# Patient Record
Sex: Male | Born: 1955 | Race: White | Hispanic: No | Smoking: Current every day smoker
Health system: Southern US, Community
[De-identification: ages and names within clinical notes are randomized; demographics above are authoritative.]

## PROBLEM LIST (undated history)

## (undated) DIAGNOSIS — Z930 Tracheostomy status: Secondary | ICD-10-CM

## (undated) DIAGNOSIS — M259 Joint disorder, unspecified: Secondary | ICD-10-CM

## (undated) DIAGNOSIS — J302 Other seasonal allergic rhinitis: Secondary | ICD-10-CM

## (undated) DIAGNOSIS — J449 Chronic obstructive pulmonary disease, unspecified: Secondary | ICD-10-CM

## (undated) DIAGNOSIS — M1990 Unspecified osteoarthritis, unspecified site: Secondary | ICD-10-CM

## (undated) DIAGNOSIS — Z972 Presence of dental prosthetic device (complete) (partial): Secondary | ICD-10-CM

---

## 2012-05-07 ENCOUNTER — Ambulatory Visit: Payer: Self-pay

## 2013-10-15 IMAGING — CR DG KNEE 1-2V*L*
1 series · 2 of 2 positions shown · non-contrast
Comparison: none

REASON FOR EXAM: Arthritis, - FAX TP [REDACTED]
COMMENTS:

PROCEDURE:     DXR - DXR KNEE LEFT AP AND LATERAL  - May 07, 2012  [DATE]
RESULT:     History: Arthritis.
Comparison Study: No prior.

[Series 1: t knee ap left · 0.14mm/px · 2 of 2 slices shown]
[im 1/2]
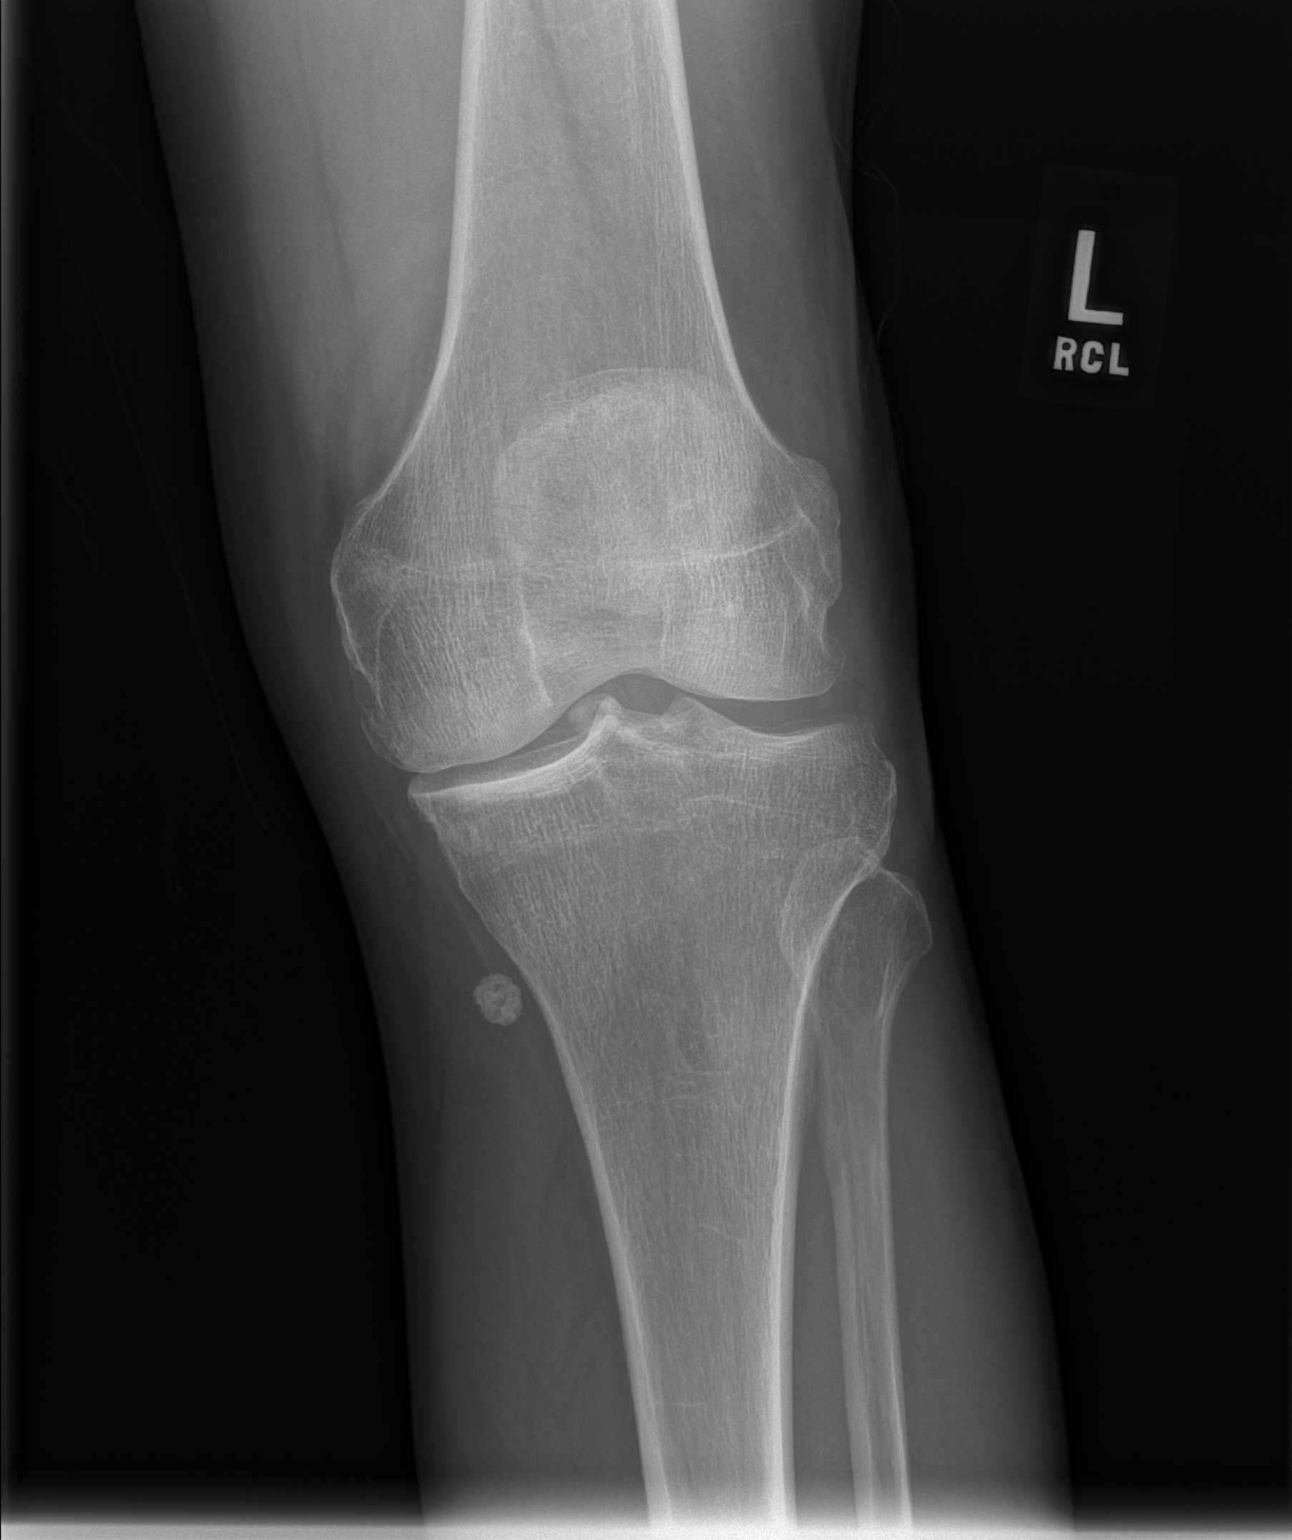
[im 2/2]
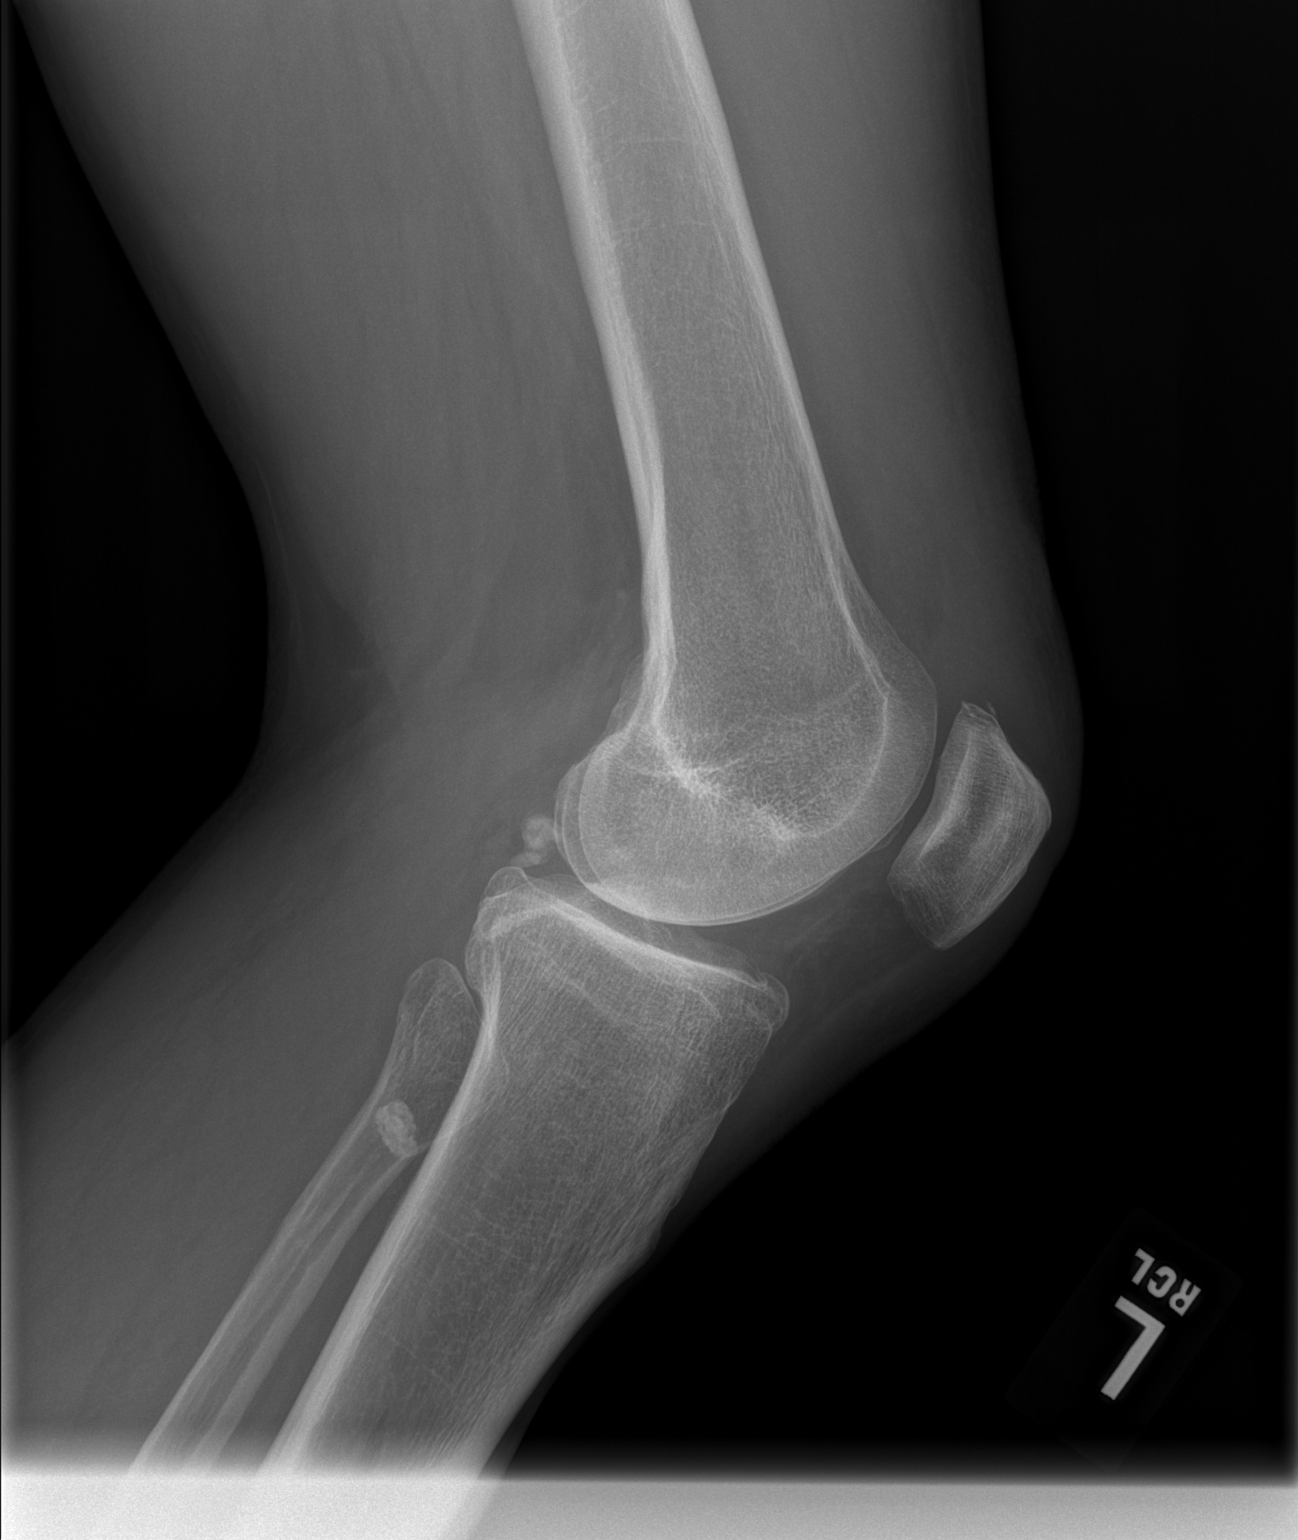

[2 of 2 positions shown; findings below may reference images not displayed]

FINDINGS: Patellofemoral and femoral-tibial degenerative change noted. Loose
bodies are noted about the left knee joint space. Loss of medial compartment
knee joint space particularly prominent. No evidence of erosive arthropathy.
No focal acute bony abnormality.
IMPRESSION: Degenerative change with loose bodies.

## 2013-10-15 IMAGING — CR DG KNEE 1-2V*R*
1 series · 2 of 2 positions shown · non-contrast
Comparison: none

REASON FOR EXAM: Arthritis, - FAX TP [REDACTED]
COMMENTS:

PROCEDURE:     DXR - DXR KNEE RIGHT AP AND LATERAL  - May 07, 2012  [DATE]
RESULT:     History: Arthritis.
Comparison Study: No prior.

[Series 1: t knee ap right · 0.14mm/px · 2 of 2 slices shown]
[im 1/2]
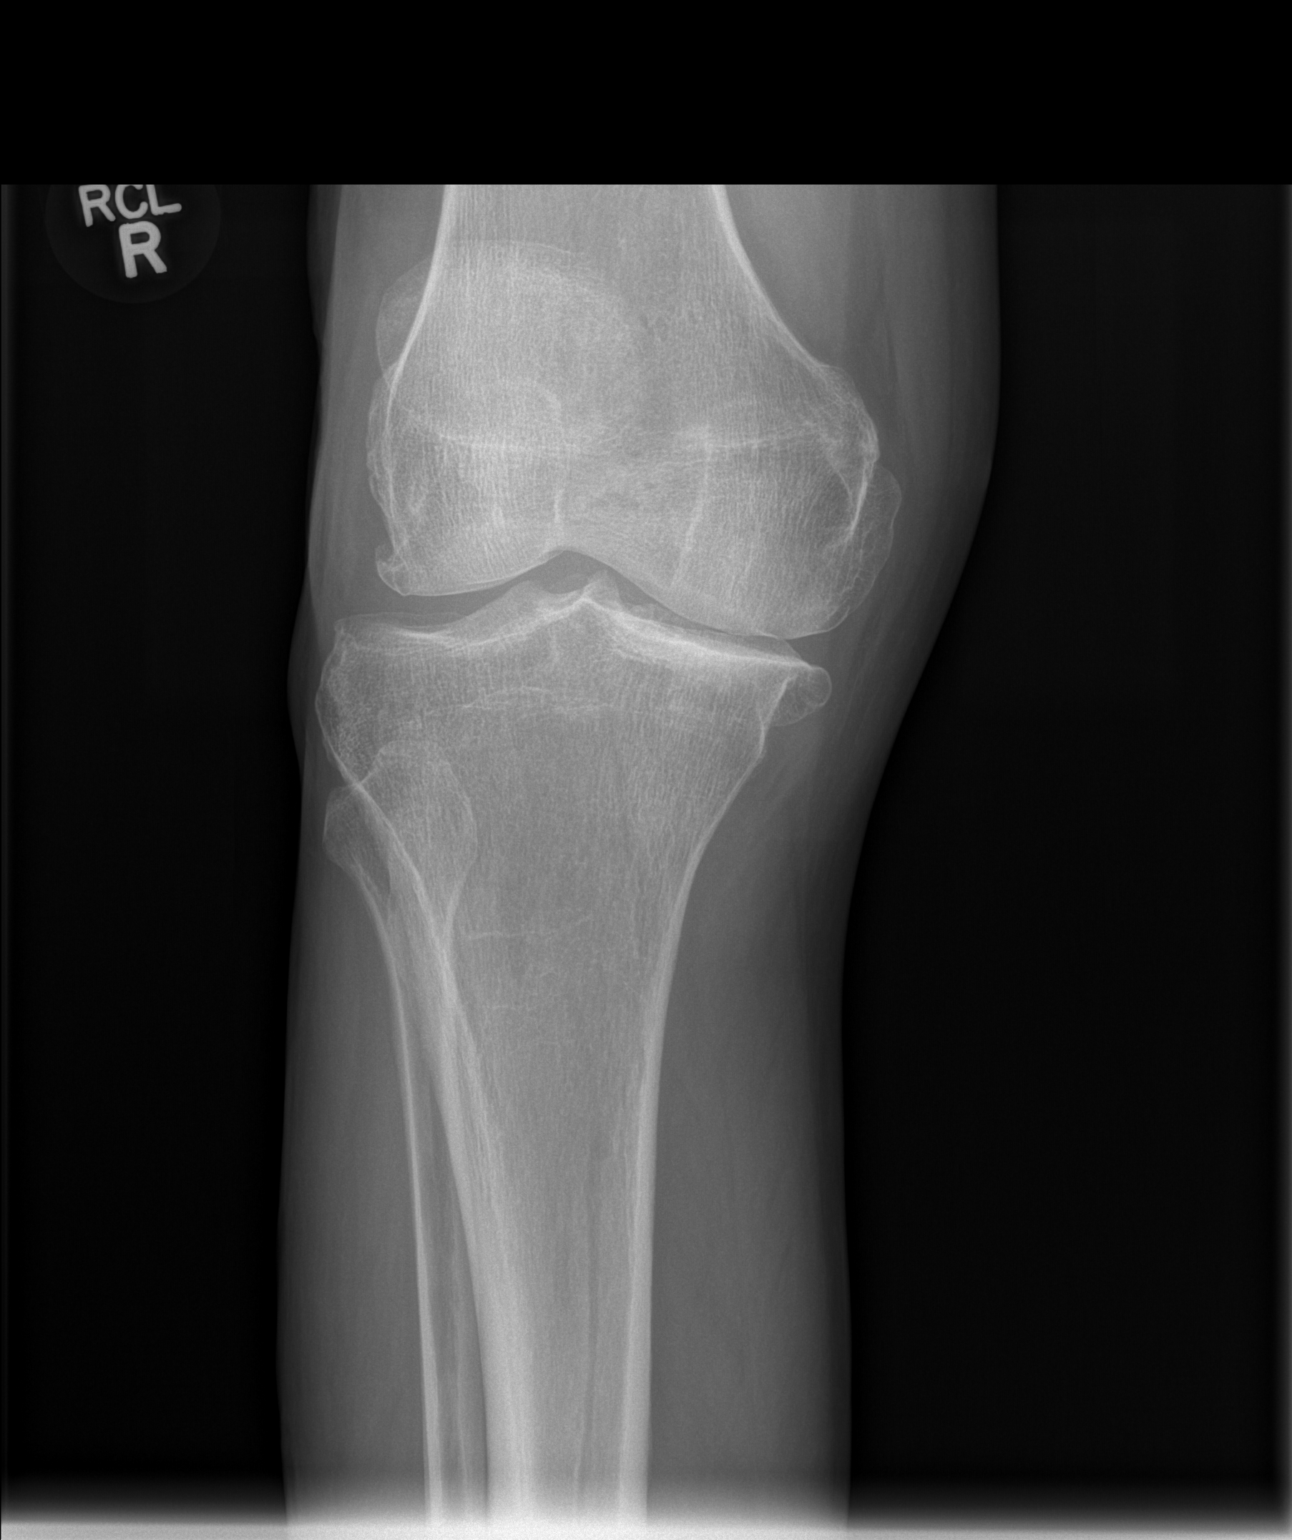
[im 2/2]
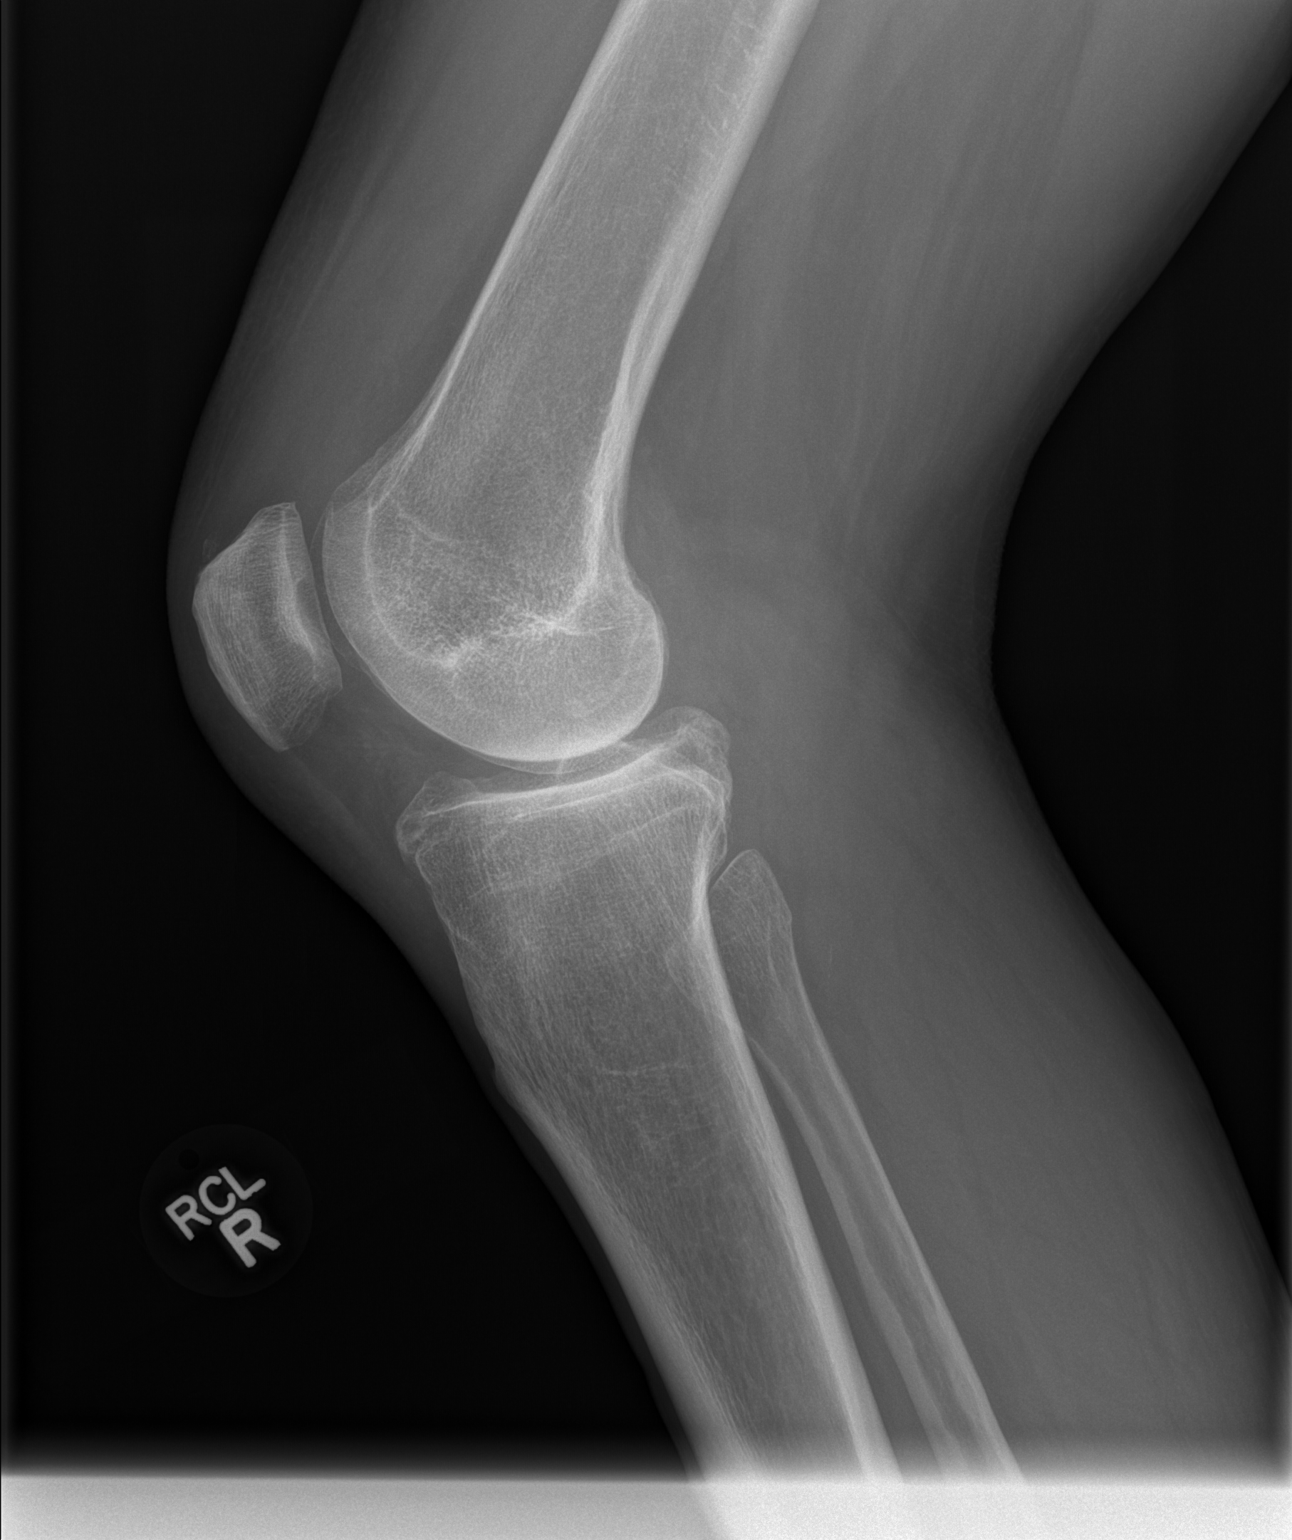

[2 of 2 positions shown; findings below may reference images not displayed]

FINDINGS: Patellofemoral and femoral-tibial degenerative change present with
particularly prominent loss of joint space over the medial compartment.
Prominent osteophytes are noted. Moderate lateral subluxation of the tibia
is present, most likely degenerative. No acute bony abnormality or focal
bony abnormality noted.
IMPRESSION: Severe degenerative changes right knee with lateral tibial
subluxation, most likely degenerative.

## 2013-10-15 IMAGING — CR DG LUMBAR SPINE 2-3V
1 series · 3 of 3 positions shown · non-contrast
Comparison: none

REASON FOR EXAM: Arthritis, Crushed Disks- FAX TP [REDACTED]
COMMENTS:

[Series 1: t lumbar spine ap · 0.14mm/px · 3 of 3 slices shown]
[im 1/3]
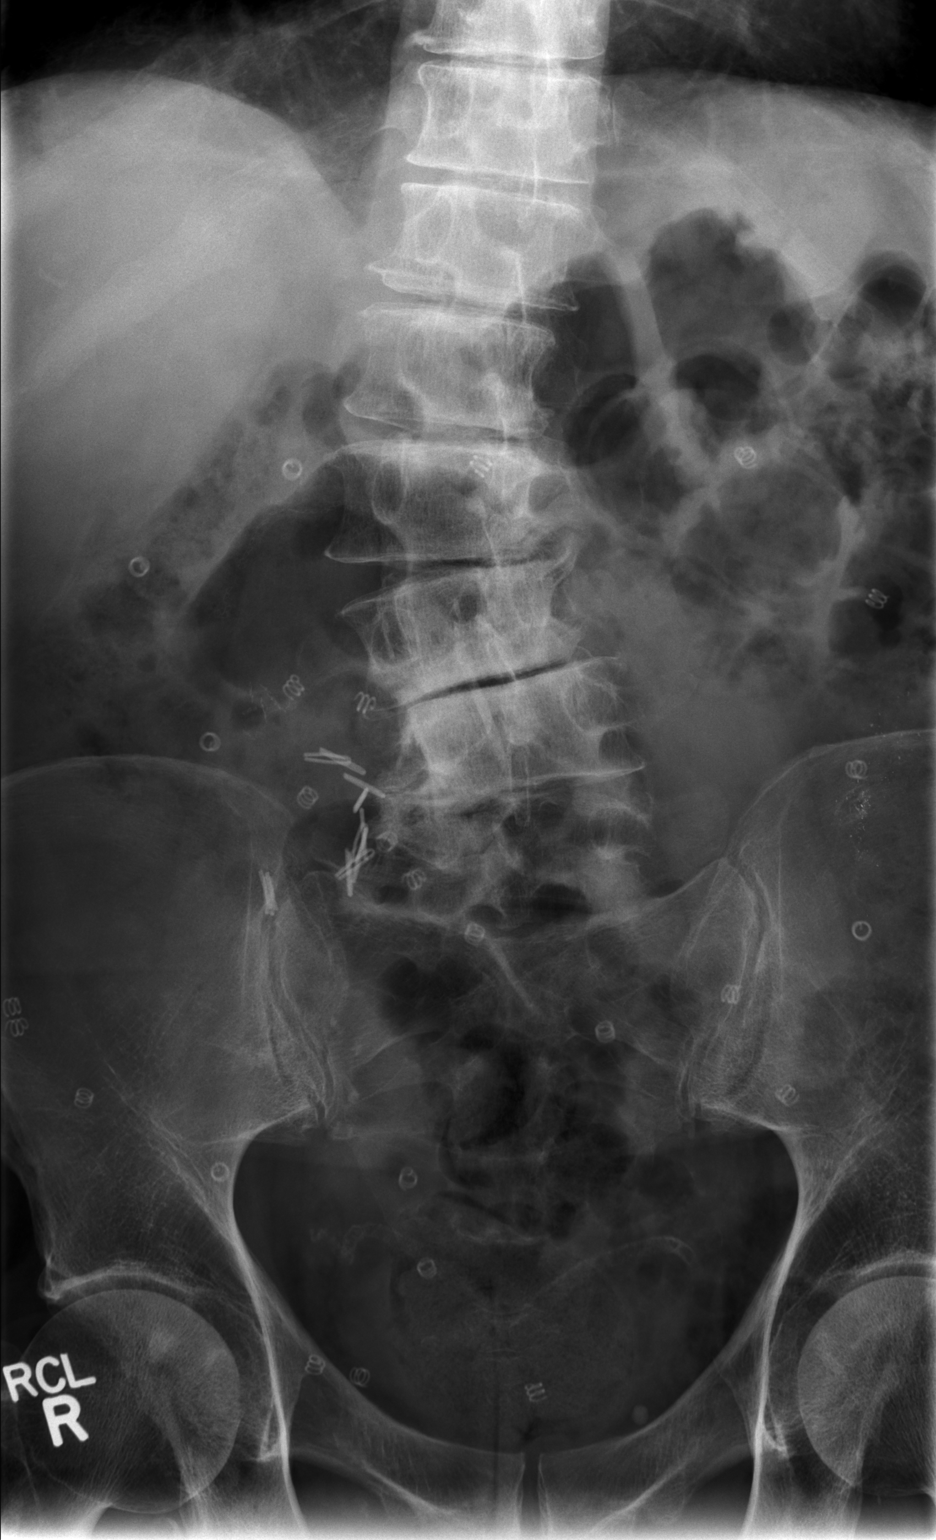
[im 2/3]
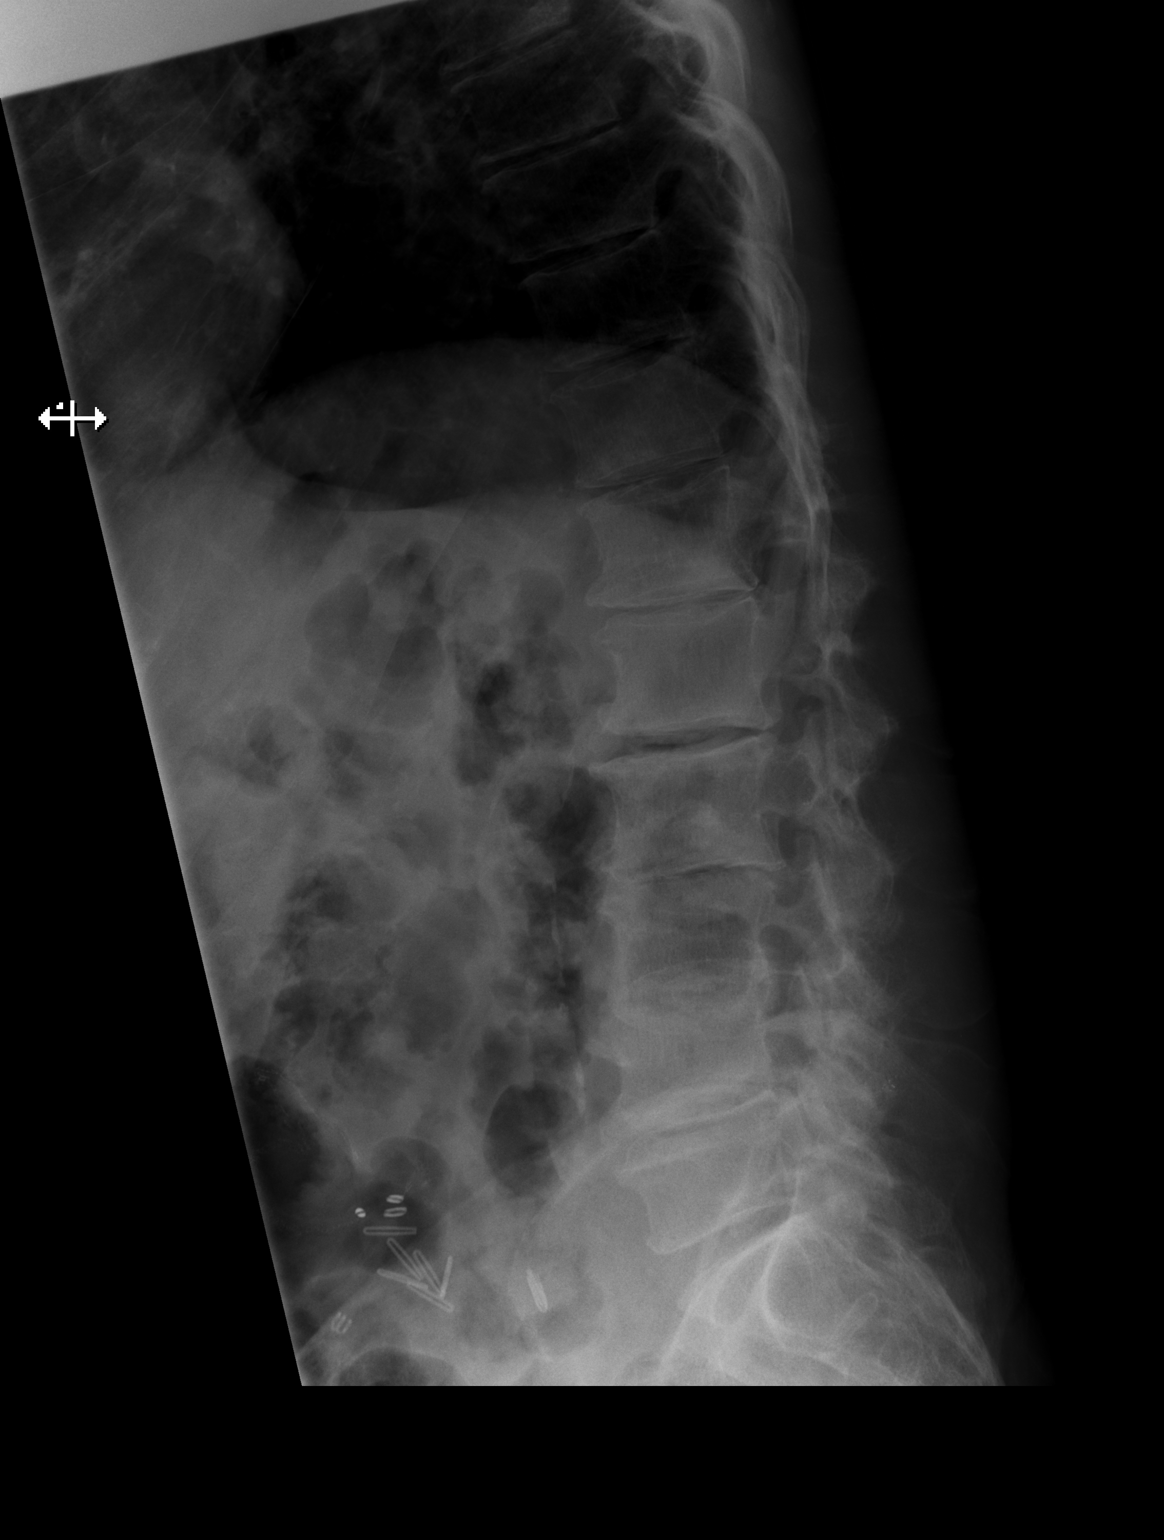
[im 3/3]
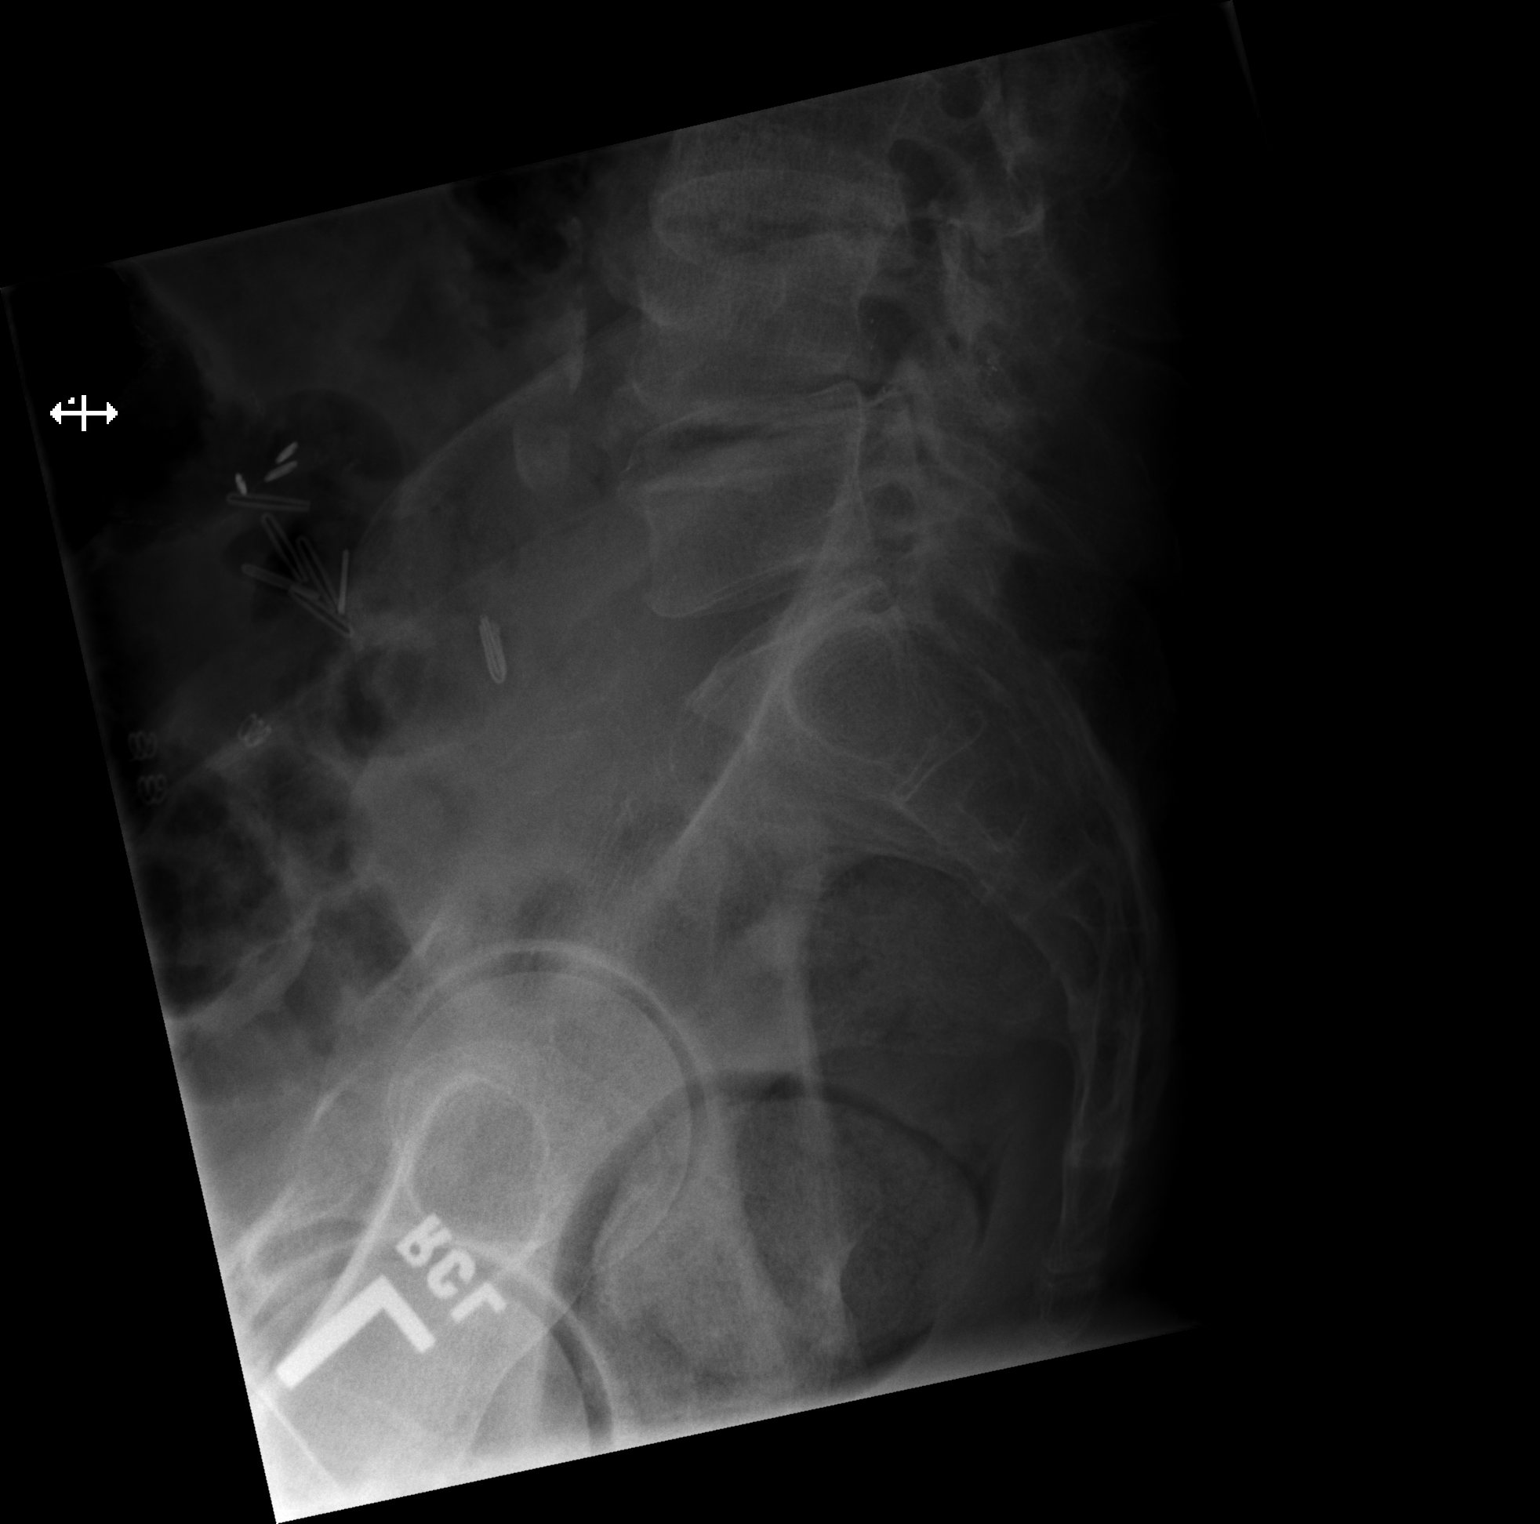

[3 of 3 positions shown; findings below may reference images not displayed]

PROCEDURE:     DXR - DXR LUMBAR SPINE AP AND LATERAL  - May 07, 2012  [DATE]

RESULT:     Severe degenerative changes thoracic and lumbar spinal with
scoliosis of the lumbar spine concave left. Pedicles are intact.
Degenerative changes present both hips. Surgical clips are noted over the
abdomen. Prior hernia repairs. Benign calcifications in pelvis including
phleboliths. Gas pattern is nonspecific.
IMPRESSION: Severe degenerative changes of the lumbar spine with
scoliosis concave left. No acute abnormality.

## 2021-11-21 ENCOUNTER — Encounter: Payer: Self-pay | Admitting: Ophthalmology

## 2021-11-26 NOTE — Discharge Instructions (Signed)

## 2021-11-27 ENCOUNTER — Ambulatory Visit: Payer: Medicare PPO | Admitting: Anesthesiology

## 2021-11-27 ENCOUNTER — Other Ambulatory Visit: Payer: Self-pay

## 2021-11-27 ENCOUNTER — Encounter: Payer: Self-pay | Admitting: Ophthalmology

## 2021-11-27 ENCOUNTER — Encounter
Admission: RE | Disposition: A | Discharge status: Home / Self Care | Payer: Self-pay | Source: Home / Self Care | Attending: Ophthalmology

## 2021-11-27 ENCOUNTER — Ambulatory Visit
Admission: RE | Admit: 2021-11-27 | Discharge: 2021-11-27 | Disposition: A | Discharge status: Home / Self Care | Payer: Medicare PPO | Attending: Ophthalmology | Admitting: Ophthalmology

## 2021-11-27 DIAGNOSIS — H2511 Age-related nuclear cataract, right eye: Secondary | ICD-10-CM | POA: Diagnosis present

## 2021-11-27 DIAGNOSIS — Z930 Tracheostomy status: Secondary | ICD-10-CM | POA: Insufficient documentation

## 2021-11-27 DIAGNOSIS — J449 Chronic obstructive pulmonary disease, unspecified: Secondary | ICD-10-CM | POA: Diagnosis not present

## 2021-11-27 DIAGNOSIS — M1990 Unspecified osteoarthritis, unspecified site: Secondary | ICD-10-CM | POA: Insufficient documentation

## 2021-11-27 DIAGNOSIS — F17210 Nicotine dependence, cigarettes, uncomplicated: Secondary | ICD-10-CM | POA: Insufficient documentation

## 2021-11-27 HISTORY — DX: Tracheostomy status: Z93.0

## 2021-11-27 HISTORY — DX: Unspecified osteoarthritis, unspecified site: M19.90

## 2021-11-27 HISTORY — DX: Presence of dental prosthetic device (complete) (partial): Z97.2

## 2021-11-27 HISTORY — PX: CATARACT EXTRACTION W/PHACO: SHX586

## 2021-11-27 HISTORY — DX: Chronic obstructive pulmonary disease, unspecified: J44.9

## 2021-11-27 SURGERY — PHACOEMULSIFICATION, CATARACT, WITH IOL INSERTION
Anesthesia: Monitor Anesthesia Care | Site: Eye | Laterality: Right

## 2021-11-27 MED ORDER — ARMC OPHTHALMIC DILATING DROPS
1.0000 | OPHTHALMIC | Status: DC | PRN
  Administered 2021-11-27 (×3): 1 via OPHTHALMIC

## 2021-11-27 MED ORDER — SIGHTPATH DOSE#1 NA HYALUR & NA CHOND-NA HYALUR IO KIT
PACK | INTRAOCULAR | Status: DC | PRN
  Administered 2021-11-27: 1 via OPHTHALMIC

## 2021-11-27 MED ORDER — TETRACAINE HCL 0.5 % OP SOLN
1.0000 [drp] | OPHTHALMIC | Status: DC | PRN
  Administered 2021-11-27 (×3): 1 [drp] via OPHTHALMIC

## 2021-11-27 MED ORDER — FENTANYL CITRATE (PF) 100 MCG/2ML IJ SOLN
INTRAMUSCULAR | Status: DC | PRN
  Administered 2021-11-27: 100 ug via INTRAVENOUS

## 2021-11-27 MED ORDER — CEFUROXIME OPHTHALMIC INJECTION 1 MG/0.1 ML
INJECTION | OPHTHALMIC | Status: DC | PRN
  Administered 2021-11-27: .1 mL via INTRACAMERAL

## 2021-11-27 MED ORDER — MIDAZOLAM HCL 2 MG/2ML IJ SOLN
INTRAMUSCULAR | Status: DC | PRN
  Administered 2021-11-27: 2 mg via INTRAVENOUS

## 2021-11-27 MED ORDER — SIGHTPATH DOSE#1 BSS IO SOLN
INTRAOCULAR | Status: DC | PRN
  Administered 2021-11-27: 1 mL via INTRAMUSCULAR

## 2021-11-27 MED ORDER — SIGHTPATH DOSE#1 BSS IO SOLN
INTRAOCULAR | Status: DC | PRN
  Administered 2021-11-27: 58 mL via OPHTHALMIC

## 2021-11-27 MED ORDER — SIGHTPATH DOSE#1 BSS IO SOLN
INTRAOCULAR | Status: DC | PRN
  Administered 2021-11-27: 15 mL

## 2021-11-27 MED ORDER — BRIMONIDINE TARTRATE-TIMOLOL 0.2-0.5 % OP SOLN
OPHTHALMIC | Status: DC | PRN
  Administered 2021-11-27: 1 [drp] via OPHTHALMIC

## 2021-11-27 SURGICAL SUPPLY — 10 items
CATARACT SUITE SIGHTPATH (MISCELLANEOUS) ×1 IMPLANT
FEE CATARACT SUITE SIGHTPATH (MISCELLANEOUS) ×1 IMPLANT
GLOVE SRG 8 PF TXTR STRL LF DI (GLOVE) ×1 IMPLANT
GLOVE SURG ENC TEXT LTX SZ7.5 (GLOVE) ×1 IMPLANT
GLOVE SURG UNDER POLY LF SZ8 (GLOVE) ×1
LENS IOL TECNIS EYHANCE 19.0 (Intraocular Lens) IMPLANT
NDL FILTER BLUNT 18X1 1/2 (NEEDLE) ×1 IMPLANT
NEEDLE FILTER BLUNT 18X1 1/2 (NEEDLE) ×1 IMPLANT
SYR 3ML LL SCALE MARK (SYRINGE) ×1 IMPLANT
WATER STERILE IRR 250ML POUR (IV SOLUTION) ×1 IMPLANT

## 2021-11-27 NOTE — Transfer of Care (Signed)
Immediate Anesthesia Transfer of Care Note  Patient: Marcus Hester  Procedure(s) Performed: CATARACT EXTRACTION PHACO AND INTRAOCULAR LENS PLACEMENT (IOC) RIGHT  11.61  01:19.3 (Right: Eye)  Patient Location: PACU  Anesthesia Type: MAC  Level of Consciousness: awake, alert  and patient cooperative  Airway and Oxygen Therapy: Patient Spontanous Breathing and Patient connected to supplemental oxygen  Post-op Assessment: Post-op Vital signs reviewed, Patient's Cardiovascular Status Stable, Respiratory Function Stable, Patent Airway and No signs of Nausea or vomiting  Post-op Vital Signs: Reviewed and stable  Complications: There were no known notable events for this encounter.

## 2021-11-27 NOTE — H&P (Signed)
  [facility]   Primary Care Physician:  Inc, [facility] Ophthalmologist: Dr. Lockie Mola  Pre-Procedure History & Physical: HPI:  Marcus Hester is a 66 y.o. male here for ophthalmic surgery.   Past Medical History:  Diagnosis Date   Arthritis    COPD (chronic obstructive pulmonary disease) (HCC)    Tracheostomy present (HCC)    Wears dentures    full upper    History reviewed. No pertinent surgical history.  Prior to Admission medications   Medication Sig Start Date End Date Taking? Authorizing Provider  acetaminophen (TYLENOL) 650 MG CR tablet Take 650 mg by mouth every 8 (eight) hours as needed for pain.   Yes [provider]  budesonide (PULMICORT) 0.25 MG/2ML nebulizer solution Take 0.25 mg by nebulization 2 (two) times daily.   Yes [provider]  gabapentin (NEURONTIN) 100 MG capsule Take 100 mg by mouth 3 (three) times daily.   Yes [provider]  phenylephrine (SUDAFED PE) 10 MG TABS tablet Take 10 mg by mouth in the morning and at bedtime.   Yes [provider]    Allergies as of 10/25/2021   (Not on File)    History reviewed. No pertinent family history.  Social History   Socioeconomic History   Marital status: Divorced    Spouse name: Not on file   Number of children: Not on file   Years of education: Not on file   Highest education level: Not on file  Occupational History   Not on file  Tobacco Use   Smoking status: Every Day    Packs/day: 1.00    Years: 50.00    Total pack years: 50.00    Types: Cigarettes   Smokeless tobacco: Never   Tobacco comments:    Started smoking around age 84  Vaping Use   Vaping Use: Never used  Substance and Sexual Activity   Alcohol use: Not Currently   Drug use: Not Currently   Sexual activity: Not on file  Other Topics Concern   Not on file  Social History Narrative   Not on file   Social Determinants of Health   Financial Resource Strain:  Not on file  Food Insecurity: Not on file  Transportation Needs: Not on file  Physical Activity: Not on file  Stress: Not on file  Social Connections: Not on file  Intimate Partner Violence: Not on file    Review of Systems: See HPI, otherwise negative ROS  Physical Exam: Ht 5\' 9"  (1.753 m)   Wt 77.1 kg   BMI 25.10 kg/m  General:   Alert,  pleasant and cooperative in NAD Head:  Normocephalic and atraumatic. Lungs:  Clear to auscultation.    Heart:  Regular rate and rhythm.   Impression/Plan: Marcus Hester is here for ophthalmic surgery.  Risks, benefits, limitations, and alternatives regarding ophthalmic surgery have been reviewed with the patient.  Questions have been answered.  All parties agreeable.   Reino Kent, MD  11/27/2021, 7:30 AM

## 2021-11-27 NOTE — Anesthesia Postprocedure Evaluation (Signed)
Anesthesia Post Note  Patient: Marcus Hester  Procedure(s) Performed: CATARACT EXTRACTION PHACO AND INTRAOCULAR LENS PLACEMENT (IOC) RIGHT  11.61  01:19.3 (Right: Eye)  Patient location during evaluation: PACU Anesthesia Type: MAC Level of consciousness: awake and alert Pain management: pain level controlled Vital Signs Assessment: post-procedure vital signs reviewed and stable Respiratory status: spontaneous breathing, nonlabored ventilation, respiratory function stable and patient connected to nasal cannula oxygen Cardiovascular status: stable and blood pressure returned to baseline Postop Assessment: no apparent nausea or vomiting Anesthetic complications: no   There were no known notable events for this encounter.   Last Vitals: There were no vitals filed for this visit.  Last Pain: There were no vitals filed for this visit.               Ilene Qua

## 2021-11-27 NOTE — Anesthesia Postprocedure Evaluation (Signed)
Anesthesia Post Note  Patient: Marcus Hester  Procedure(s) Performed: CATARACT EXTRACTION PHACO AND INTRAOCULAR LENS PLACEMENT (IOC) RIGHT  11.61  01:19.3 (Right: Eye)  Patient location during evaluation: PACU Anesthesia Type: MAC Level of consciousness: awake and alert Pain management: pain level controlled Vital Signs Assessment: post-procedure vital signs reviewed and stable Respiratory status: spontaneous breathing, nonlabored ventilation, respiratory function stable and patient connected to nasal cannula oxygen Cardiovascular status: stable and blood pressure returned to baseline Postop Assessment: no apparent nausea or vomiting Anesthetic complications: no   There were no known notable events for this encounter.   Last Vitals:  Vitals:   11/27/21 0905 11/27/21 0910  BP: 101/64 96/62  Pulse: 69 80  Resp: 13 15  Temp:    SpO2: 90% 91%    Last Pain: There were no vitals filed for this visit.               Ilene Qua

## 2021-11-27 NOTE — Op Note (Signed)
  LOCATION:  [facility]   PREOPERATIVE DIAGNOSIS:    Nuclear sclerotic cataract right eye. H25.11   POSTOPERATIVE DIAGNOSIS:  Nuclear sclerotic cataract right eye.     PROCEDURE:  Phacoemusification with posterior chamber intraocular lens placement of the right eye   ULTRASOUND TIME: Procedure(s): CATARACT EXTRACTION PHACO AND INTRAOCULAR LENS PLACEMENT (IOC) RIGHT  11.61  01:19.3 (Right)  LENS:   Implant Name Type Inv. Item Serial No. Manufacturer Lot No. LRB No. Used Action  LENS IOL TECNIS EYHANCE 19.0 - [id number] Intraocular Lens LENS IOL TECNIS EYHANCE 19.0 [id number] [employer]  Right 1 Implanted         SURGEON:  Deirdre Evener, MD   ANESTHESIA:  Topical with tetracaine drops and 2% Xylocaine jelly, augmented with 1% preservative-free intracameral lidocaine.    COMPLICATIONS:  None.   DESCRIPTION OF PROCEDURE:  The patient was identified in the holding room and transported to the operating room and placed in the supine position under the operating microscope.  The right eye was identified as the operative eye and it was prepped and draped in the usual sterile ophthalmic fashion.   A 1 millimeter clear-corneal paracentesis was made at the 12:00 position.  0.5 ml of preservative-free 1% lidocaine was injected into the anterior chamber. The anterior chamber was filled with Viscoat viscoelastic.  A 2.4 millimeter keratome was used to make a near-clear corneal incision at the 9:00 position.  A curvilinear capsulorrhexis was made with a cystotome and capsulorrhexis forceps.  Balanced salt solution was used to hydrodissect and hydrodelineate the nucleus.   Phacoemulsification was then used in stop and chop fashion to remove the lens nucleus and epinucleus.  The remaining cortex was then removed using the irrigation and aspiration handpiece. Provisc was then placed into the capsular bag to distend it for lens placement.  A lens was then injected into the capsular bag.   The remaining viscoelastic was aspirated.   Wounds were hydrated with balanced salt solution.  The anterior chamber was inflated to a physiologic pressure with balanced salt solution.  No wound leaks were noted. Cefuroxime 0.1 ml of a 10mg /ml solution was injected into the anterior chamber for a dose of 1 mg of intracameral antibiotic at the completion of the case.   Timolol and Brimonidine drops were applied to the eye.  The patient was taken to the recovery room in stable condition without complications of anesthesia or surgery.   Coti Burd 11/27/2021, 8:49 AM

## 2021-11-27 NOTE — Anesthesia Preprocedure Evaluation (Addendum)
Anesthesia Evaluation  Patient identified by MRN, date of birth, ID band Patient awake    Reviewed: Allergy & Precautions, NPO status , Patient's Chart, lab work & pertinent test results  Airway Mallampati: III  TM Distance: >3 FB Neck ROM: full   Comment: Hx of trach 06/2021  Dental  (+) Edentulous Upper, Edentulous Lower   Pulmonary COPD,  COPD inhaler, Current SmokerPatient did not abstain from smoking.   Pulmonary exam normal        Cardiovascular negative cardio ROS Normal cardiovascular exam     Neuro/Psych  PSYCHIATRIC DISORDERS  Depression    negative neurological ROS     GI/Hepatic negative GI ROS,,,(+)     substance abuse  alcohol use  Endo/Other  negative endocrine ROS    Renal/GU negative Renal ROS  negative genitourinary   Musculoskeletal   Abdominal   Peds  Hematology negative hematology ROS (+)   Anesthesia Other Findings Past Medical History: No date: Arthritis No date: COPD (chronic obstructive pulmonary disease) (HCC) No date: Tracheostomy present ([place]) No date: Wears dentures     Comment:  full upper  History reviewed. No pertinent surgical history.  BMI    Body Mass Index: 25.10 kg/m      Reproductive/Obstetrics negative OB ROS                             Anesthesia Physical Anesthesia Plan  ASA: 2  Anesthesia Plan: MAC   Post-op Pain Management: Minimal or no pain anticipated   Induction: Intravenous  PONV Risk Score and Plan:   Airway Management Planned: Natural Airway and Nasal Cannula  Additional Equipment:   Intra-op Plan:   Post-operative Plan:   Informed Consent: I have reviewed the patients History and Physical, chart, labs and discussed the procedure including the risks, benefits and alternatives for the proposed anesthesia with the patient or authorized representative who has indicated his/her understanding and acceptance.      Dental Advisory Given  Plan Discussed with: Anesthesiologist, CRNA and Surgeon  Anesthesia Plan Comments: (Patient consented for risks of anesthesia including but not limited to:  - adverse reactions to medications - risk of airway placement if required - damage to eyes, teeth, lips or other oral mucosa - nerve damage due to positioning  - sore throat or hoarseness - Damage to heart, brain, nerves, lungs, other parts of body or loss of life  Patient voiced understanding.)       Anesthesia Quick Evaluation

## 2021-11-28 ENCOUNTER — Encounter: Payer: Self-pay | Admitting: Ophthalmology

## 2021-12-09 ENCOUNTER — Encounter: Payer: Self-pay | Admitting: Ophthalmology

## 2021-12-09 ENCOUNTER — Other Ambulatory Visit: Payer: Self-pay

## 2021-12-09 NOTE — Discharge Instructions (Signed)

## 2021-12-11 ENCOUNTER — Encounter: Payer: Self-pay | Admitting: Ophthalmology

## 2021-12-11 ENCOUNTER — Ambulatory Visit: Payer: Medicare PPO | Admitting: Anesthesiology

## 2021-12-11 ENCOUNTER — Other Ambulatory Visit: Payer: Self-pay

## 2021-12-11 ENCOUNTER — Ambulatory Visit
Admission: RE | Admit: 2021-12-11 | Discharge: 2021-12-11 | Disposition: A | Discharge status: Home / Self Care | Payer: Medicare PPO | Attending: Ophthalmology | Admitting: Ophthalmology

## 2021-12-11 ENCOUNTER — Encounter
Admission: RE | Disposition: A | Discharge status: Home / Self Care | Payer: Self-pay | Source: Home / Self Care | Attending: Ophthalmology

## 2021-12-11 DIAGNOSIS — J449 Chronic obstructive pulmonary disease, unspecified: Secondary | ICD-10-CM | POA: Diagnosis not present

## 2021-12-11 DIAGNOSIS — F17210 Nicotine dependence, cigarettes, uncomplicated: Secondary | ICD-10-CM | POA: Insufficient documentation

## 2021-12-11 DIAGNOSIS — H2512 Age-related nuclear cataract, left eye: Secondary | ICD-10-CM | POA: Diagnosis not present

## 2021-12-11 HISTORY — DX: Joint disorder, unspecified: M25.9

## 2021-12-11 HISTORY — PX: CATARACT EXTRACTION W/PHACO: SHX586

## 2021-12-11 HISTORY — DX: Other seasonal allergic rhinitis: J30.2

## 2021-12-11 SURGERY — PHACOEMULSIFICATION, CATARACT, WITH IOL INSERTION
Anesthesia: Monitor Anesthesia Care | Site: Eye | Laterality: Left

## 2021-12-11 MED ORDER — MIDAZOLAM HCL 2 MG/2ML IJ SOLN
INTRAMUSCULAR | Status: DC | PRN
  Administered 2021-12-11: 2 mg via INTRAVENOUS

## 2021-12-11 MED ORDER — TETRACAINE HCL 0.5 % OP SOLN
1.0000 [drp] | OPHTHALMIC | Status: DC | PRN
  Administered 2021-12-11 (×3): 1 [drp] via OPHTHALMIC

## 2021-12-11 MED ORDER — BRIMONIDINE TARTRATE-TIMOLOL 0.2-0.5 % OP SOLN
OPHTHALMIC | Status: DC | PRN
  Administered 2021-12-11: 1 [drp] via OPHTHALMIC

## 2021-12-11 MED ORDER — ONDANSETRON HCL 4 MG/2ML IJ SOLN: 4.0000 mg | Freq: Once | INTRAMUSCULAR | Status: DC | PRN

## 2021-12-11 MED ORDER — SIGHTPATH DOSE#1 BSS IO SOLN
INTRAOCULAR | Status: DC | PRN
  Administered 2021-12-11: 15 mL

## 2021-12-11 MED ORDER — LACTATED RINGERS IV SOLN: INTRAVENOUS | Status: DC

## 2021-12-11 MED ORDER — SIGHTPATH DOSE#1 BSS IO SOLN
INTRAOCULAR | Status: DC | PRN
  Administered 2021-12-11: 1 mL via INTRAMUSCULAR

## 2021-12-11 MED ORDER — CEFUROXIME OPHTHALMIC INJECTION 1 MG/0.1 ML
INJECTION | OPHTHALMIC | Status: DC | PRN
  Administered 2021-12-11: .1 mL via INTRACAMERAL

## 2021-12-11 MED ORDER — SIGHTPATH DOSE#1 NA HYALUR & NA CHOND-NA HYALUR IO KIT
PACK | INTRAOCULAR | Status: DC | PRN
  Administered 2021-12-11: 1 via OPHTHALMIC

## 2021-12-11 MED ORDER — SIGHTPATH DOSE#1 BSS IO SOLN
INTRAOCULAR | Status: DC | PRN
  Administered 2021-12-11: 46 mL via OPHTHALMIC

## 2021-12-11 MED ORDER — ARMC OPHTHALMIC DILATING DROPS
1.0000 | OPHTHALMIC | Status: DC | PRN
  Administered 2021-12-11 (×3): 1 via OPHTHALMIC

## 2021-12-11 SURGICAL SUPPLY — 10 items
CATARACT SUITE SIGHTPATH (MISCELLANEOUS) ×1 IMPLANT
FEE CATARACT SUITE SIGHTPATH (MISCELLANEOUS) ×1 IMPLANT
GLOVE SRG 8 PF TXTR STRL LF DI (GLOVE) ×1 IMPLANT
GLOVE SURG ENC TEXT LTX SZ7.5 (GLOVE) ×1 IMPLANT
GLOVE SURG UNDER POLY LF SZ8 (GLOVE) ×1
LENS IOL TECNIS EYHANCE 19.0 (Intraocular Lens) IMPLANT
NDL FILTER BLUNT 18X1 1/2 (NEEDLE) ×1 IMPLANT
NEEDLE FILTER BLUNT 18X1 1/2 (NEEDLE) ×1 IMPLANT
SYR 3ML LL SCALE MARK (SYRINGE) ×1 IMPLANT
WATER STERILE IRR 250ML POUR (IV SOLUTION) ×1 IMPLANT

## 2021-12-11 NOTE — Transfer of Care (Signed)
Immediate Anesthesia Transfer of Care Note  Patient: Marcus Hester  Procedure(s) Performed: CATARACT EXTRACTION PHACO AND INTRAOCULAR LENS PLACEMENT (IOC) LEFT  7.73  00:57.1 (Left: Eye)  Patient Location: PACU  Anesthesia Type: MAC  Level of Consciousness: awake, alert  and patient cooperative  Airway and Oxygen Therapy: Patient Spontanous Breathing and Patient connected to supplemental oxygen  Post-op Assessment: Post-op Vital signs reviewed, Patient's Cardiovascular Status Stable, Respiratory Function Stable, Patent Airway and No signs of Nausea or vomiting  Post-op Vital Signs: Reviewed and stable  Complications: No notable events documented.

## 2021-12-11 NOTE — Op Note (Signed)
  OPERATIVE NOTE  Marcus Hester [id number] 12/11/2021   PREOPERATIVE DIAGNOSIS:  Nuclear sclerotic cataract left eye. H25.12   POSTOPERATIVE DIAGNOSIS:    Nuclear sclerotic cataract left eye.     PROCEDURE:  Phacoemusification with posterior chamber intraocular lens placement of the left eye  Ultrasound time: Procedure(s): CATARACT EXTRACTION PHACO AND INTRAOCULAR LENS PLACEMENT (IOC) LEFT  7.73  00:57.1 (Left)  LENS:   Implant Name Type Inv. Item Serial No. Manufacturer Lot No. LRB No. Used Action  LENS IOL TECNIS EYHANCE 19.0 - [id number] Intraocular Lens LENS IOL TECNIS EYHANCE 19.0 [id number] [employer]  Left 1 Implanted      SURGEON:  Deirdre Evener, MD   ANESTHESIA:  Topical with tetracaine drops and 2% Xylocaine jelly, augmented with 1% preservative-free intracameral lidocaine.    COMPLICATIONS:  None.   DESCRIPTION OF PROCEDURE:  The patient was identified in the holding room and transported to the operating room and placed in the supine position under the operating microscope.  The left eye was identified as the operative eye and it was prepped and draped in the usual sterile ophthalmic fashion.   A 1 millimeter clear-corneal paracentesis was made at the 1:30 position.  0.5 ml of preservative-free 1% lidocaine was injected into the anterior chamber.  The anterior chamber was filled with Viscoat viscoelastic.  A 2.4 millimeter keratome was used to make a near-clear corneal incision at the 10:30 position.  .  A curvilinear capsulorrhexis was made with a cystotome and capsulorrhexis forceps.  Balanced salt solution was used to hydrodissect and hydrodelineate the nucleus.   Phacoemulsification was then used in stop and chop fashion to remove the lens nucleus and epinucleus.  The remaining cortex was then removed using the irrigation and aspiration handpiece. Provisc was then placed into the capsular bag to distend it for lens placement.  A lens was then injected into the  capsular bag.  The remaining viscoelastic was aspirated.   Wounds were hydrated with balanced salt solution.  The anterior chamber was inflated to a physiologic pressure with balanced salt solution.  No wound leaks were noted. Cefuroxime 0.1 ml of a 10mg /ml solution was injected into the anterior chamber for a dose of 1 mg of intracameral antibiotic at the completion of the case.   Timolol and Brimonidine drops were applied to the eye.  The patient was taken to the recovery room in stable condition without complications of anesthesia or surgery.  Marcus Hester 12/11/2021, 9:24 AM

## 2021-12-11 NOTE — Anesthesia Postprocedure Evaluation (Signed)
Anesthesia Post Note  Patient: Marcus Hester  Procedure(s) Performed: CATARACT EXTRACTION PHACO AND INTRAOCULAR LENS PLACEMENT (IOC) LEFT  7.73  00:57.1 (Left: Eye)  Patient location during evaluation: PACU Anesthesia Type: MAC Level of consciousness: awake and alert Pain management: pain level controlled Vital Signs Assessment: post-procedure vital signs reviewed and stable Respiratory status: spontaneous breathing, nonlabored ventilation, respiratory function stable and patient connected to nasal cannula oxygen Cardiovascular status: stable and blood pressure returned to baseline Postop Assessment: no apparent nausea or vomiting Anesthetic complications: no   There were no known notable events for this encounter.   Last Vitals:  Vitals:   12/11/21 0926 12/11/21 0930  BP: 128/73 126/71  Pulse: 71 73  Resp: 18 14  Temp: 36.5 C   SpO2: 95% 97%    Last Pain:  Vitals:   12/11/21 0930  TempSrc:   PainSc: 0-No pain                 Arita Miss

## 2021-12-11 NOTE — Anesthesia Preprocedure Evaluation (Signed)
Anesthesia Evaluation  Patient identified by MRN, date of birth, ID band Patient awake    Reviewed: Allergy & Precautions, NPO status , Patient's Chart, lab work & pertinent test results  History of Anesthesia Complications Negative for: history of anesthetic complications  Airway Mallampati: III  TM Distance: >3 FB Neck ROM: full   Comment: Hx of trach 06/2021  Dental  (+) Edentulous Lower, Upper Dentures   Pulmonary neg sleep apnea, COPD,  COPD inhaler, Current SmokerPatient did not abstain from smoking.   Pulmonary exam normal breath sounds clear to auscultation       Cardiovascular Exercise Tolerance: Good METS(-) hypertension(-) CAD and (-) Past MI negative cardio ROS Normal cardiovascular exam(-) dysrhythmias  Rhythm:Regular Rate:Normal - Systolic murmurs    Neuro/Psych  PSYCHIATRIC DISORDERS  Depression    negative neurological ROS     GI/Hepatic negative GI ROS,neg GERD  ,,(+)     (-) substance abuse    Endo/Other  negative endocrine ROSneg diabetes    Renal/GU negative Renal ROS  negative genitourinary   Musculoskeletal   Abdominal   Peds  Hematology negative hematology ROS (+)   Anesthesia Other Findings Past Medical History: No date: Arthritis No date: COPD (chronic obstructive pulmonary disease) (HCC) No date: Tracheostomy present ([place]) No date: Wears dentures     Comment:  full upper  History reviewed. No pertinent surgical history.  BMI    Body Mass Index: 25.10 kg/m      Reproductive/Obstetrics negative OB ROS                              Anesthesia Physical Anesthesia Plan  ASA: 2  Anesthesia Plan: MAC   Post-op Pain Management: Minimal or no pain anticipated   Induction: Intravenous  PONV Risk Score and Plan: 0 and Midazolam  Airway Management Planned: Natural Airway and Nasal Cannula  Additional Equipment:   Intra-op Plan:   Post-operative  Plan:   Informed Consent: I have reviewed the patients History and Physical, chart, labs and discussed the procedure including the risks, benefits and alternatives for the proposed anesthesia with the patient or authorized representative who has indicated his/her understanding and acceptance.     Dental Advisory Given  Plan Discussed with: Anesthesiologist, CRNA and Surgeon  Anesthesia Plan Comments: (Explained risks of anesthesia, including PONV, and rare emergencies such as cardiac events, respiratory problems, and allergic reactions, requiring invasive intervention. Discussed the role of CRNA in patient's perioperative care. Patient understands.  Sister says that after last cataract in postop, it took him a while to wakeup and his blood pressure was in the 80s/60s. I assured we will give less sedation at first, and work our way up as necessary. )        Anesthesia Quick Evaluation

## 2021-12-11 NOTE — H&P (Signed)
[facility]   Primary Care Physician:  Inc, [facility] Ophthalmologist: Dr. Lockie Mola  Pre-Procedure History & Physical: HPI:  Marcus Hester is a 66 y.o. male here for ophthalmic surgery.   Past Medical History:  Diagnosis Date   Arthritis    COPD (chronic obstructive pulmonary disease) (HCC)    Seasonal allergies    Shoulder problem    Right shoulder fracture   Wears dentures    full upper    Past Surgical History:  Procedure Laterality Date   CATARACT EXTRACTION W/PHACO Right 11/27/2021   Procedure: CATARACT EXTRACTION PHACO AND INTRAOCULAR LENS PLACEMENT (IOC) RIGHT  11.61  01:19.3;  Surgeon: Lockie Mola, MD;  Location: [facility];  Service: Ophthalmology;  Laterality: Right;    Prior to Admission medications   Medication Sig Start Date End Date Taking? Authorizing Provider  acetaminophen (TYLENOL) 650 MG CR tablet Take 650 mg by mouth every 8 (eight) hours as needed for pain.   Yes [provider]  budesonide (PULMICORT) 0.25 MG/2ML nebulizer solution Take 0.25 mg by nebulization 2 (two) times daily.   Yes [provider]  gabapentin (NEURONTIN) 100 MG capsule Take 100 mg by mouth 3 (three) times daily.   Yes [provider]  Multiple Vitamins-Minerals (ONE A DAY MENS VITACRAVES) CHEW Chew by mouth.   Yes [provider]  phenylephrine (SUDAFED PE) 10 MG TABS tablet Take 10 mg by mouth in the morning and at bedtime.   Yes [provider]    Allergies as of 10/25/2021   (Not on File)    History reviewed. No pertinent family history.  Social History   Socioeconomic History   Marital status: Divorced    Spouse name: Not on file   Number of children: Not on file   Years of education: Not on file   Highest education level: Not on file  Occupational History   Not on file  Tobacco Use   Smoking status: Every Day    Packs/day: 1.50    Years: 50.00    Total pack years:  75.00    Types: Cigarettes   Smokeless tobacco: Never   Tobacco comments:    Started smoking around age 28  Vaping Use   Vaping Use: Never used  Substance and Sexual Activity   Alcohol use: Not Currently   Drug use: Not Currently   Sexual activity: Not on file  Other Topics Concern   Not on file  Social History Narrative   Not on file   Social Determinants of Health   Financial Resource Strain: Not on file  Food Insecurity: Not on file  Transportation Needs: Not on file  Physical Activity: Not on file  Stress: Not on file  Social Connections: Not on file  Intimate Partner Violence: Not on file    Review of Systems: See HPI, otherwise negative ROS  Physical Exam: BP (!) 150/80   Pulse 78   Temp 97.8 F (36.6 C) (Temporal)   Resp 12   Ht 5\' 9"  (1.753 m)   Wt 70.3 kg   SpO2 98%   BMI 22.89 kg/m  General:   Alert,  pleasant and cooperative in NAD Head:  Normocephalic and atraumatic. Lungs:  Clear to auscultation.    Heart:  Regular rate and rhythm.   Impression/Plan: Marcus Hester is here for ophthalmic surgery.  Risks, benefits, limitations, and alternatives regarding ophthalmic surgery have been reviewed with the patient.  Questions have been answered.  All parties agreeable.  Lockie Mola, MD  12/11/2021, 9:00 AM

## 2021-12-12 ENCOUNTER — Encounter: Payer: Self-pay | Admitting: Ophthalmology
